# Patient Record
Sex: Male | Born: 1975 | Race: White | Hispanic: No | Marital: Married | Smoking: Current every day smoker
Health system: Southern US, Community
[De-identification: ages and names within clinical notes are randomized; demographics above are authoritative.]

## PROBLEM LIST (undated history)

## (undated) DIAGNOSIS — M1990 Unspecified osteoarthritis, unspecified site: Secondary | ICD-10-CM

## (undated) DIAGNOSIS — M722 Plantar fascial fibromatosis: Secondary | ICD-10-CM

## (undated) DIAGNOSIS — G5600 Carpal tunnel syndrome, unspecified upper limb: Secondary | ICD-10-CM

## (undated) DIAGNOSIS — I499 Cardiac arrhythmia, unspecified: Secondary | ICD-10-CM

## (undated) HISTORY — PX: CARDIAC CATHETERIZATION: SHX172

## (undated) HISTORY — PX: CARDIAC ELECTROPHYSIOLOGY MAPPING AND ABLATION: SHX1292

---

## 2000-05-24 ENCOUNTER — Encounter: Payer: Self-pay | Admitting: Ophthalmology

## 2000-05-24 ENCOUNTER — Ambulatory Visit (HOSPITAL_COMMUNITY): Admission: RE | Admit: 2000-05-24 | Discharge: 2000-05-24 | Payer: Self-pay | Admitting: Ophthalmology

## 2014-02-25 ENCOUNTER — Ambulatory Visit (HOSPITAL_COMMUNITY): Payer: Self-pay | Attending: Emergency Medicine

## 2014-02-25 ENCOUNTER — Emergency Department (INDEPENDENT_AMBULATORY_CARE_PROVIDER_SITE_OTHER)
Admission: EM | Admit: 2014-02-25 | Discharge: 2014-02-25 | Disposition: A | Discharge status: Home / Self Care | Payer: Self-pay | Source: Home / Self Care | Attending: Emergency Medicine | Admitting: Emergency Medicine

## 2014-02-25 ENCOUNTER — Encounter (HOSPITAL_COMMUNITY): Payer: Self-pay | Admitting: Emergency Medicine

## 2014-02-25 DIAGNOSIS — R05 Cough: Secondary | ICD-10-CM | POA: Insufficient documentation

## 2014-02-25 DIAGNOSIS — R059 Cough, unspecified: Secondary | ICD-10-CM | POA: Insufficient documentation

## 2014-02-25 DIAGNOSIS — J209 Acute bronchitis, unspecified: Secondary | ICD-10-CM

## 2014-02-25 HISTORY — DX: Cardiac arrhythmia, unspecified: I49.9

## 2014-02-25 HISTORY — DX: Unspecified osteoarthritis, unspecified site: M19.90

## 2014-02-25 HISTORY — DX: Plantar fascial fibromatosis: M72.2

## 2014-02-25 HISTORY — DX: Carpal tunnel syndrome, unspecified upper limb: G56.00

## 2014-02-25 MED ORDER — ALBUTEROL SULFATE HFA 108 (90 BASE) MCG/ACT IN AERS: 2.0000 | INHALATION_SPRAY | Freq: Four times a day (QID) | RESPIRATORY_TRACT | Status: AC

## 2014-02-25 MED ORDER — AZITHROMYCIN 250 MG PO TABS: ORAL_TABLET | ORAL | Status: AC

## 2014-02-25 MED ORDER — GUAIFENESIN-CODEINE 100-10 MG/5ML PO SYRP: 10.0000 mL | ORAL_SOLUTION | Freq: Four times a day (QID) | ORAL | Status: AC | PRN

## 2014-02-25 MED ORDER — METHYLPREDNISOLONE ACETATE 80 MG/ML IJ SUSP
INTRAMUSCULAR | Status: AC
  Filled 2014-02-25: qty 1

## 2014-02-25 MED ORDER — HYDROCODONE-ACETAMINOPHEN 5-325 MG PO TABS
ORAL_TABLET | ORAL | Status: AC
  Filled 2014-02-25: qty 2

## 2014-02-25 MED ORDER — PREDNISONE 20 MG PO TABS: ORAL_TABLET | ORAL | Status: AC

## 2014-02-25 MED ORDER — METHYLPREDNISOLONE ACETATE 80 MG/ML IJ SUSP
80.0000 mg | Freq: Once | INTRAMUSCULAR | Status: AC
  Administered 2014-02-25: 80 mg via INTRAMUSCULAR

## 2014-02-25 MED ORDER — HYDROCODONE-ACETAMINOPHEN 5-325 MG PO TABS
2.0000 | ORAL_TABLET | Freq: Once | ORAL | Status: AC
  Administered 2014-02-25: 2 via ORAL

## 2014-02-25 NOTE — ED Notes (Signed)
States he has been coughing since yesterday, minimal relief w OTC medications and home treatments. C/o cough so hard that he gags and vomits Moves w free and fluid gait, NAD.

## 2014-02-25 NOTE — ED Provider Notes (Signed)
Chief Complaint   Chief Complaint  Patient presents with  . Cough    History of Present Illness   Kennyth ArnoldGarrett Burmester is a 38 year old male who has had a two-day history of dry cough, posttussive vomiting, nasal congestion, and rhinorrhea. He denies any fever, chills, headache, sore throat, chest pain, or wheezing. His cough is paroxysmal and sometimes he feels like he can't get his breath. His wife has had the same thing. The patient notes that he he was exposed to some Clorox fumes this morning, but that was after the cough began. It did seem to worsen the cough afterwards.  Review of Systems   Other than as noted above, the patient denies any of the following symptoms: Systemic:  No fevers, chills, sweats, or myalgias. Eye:  No redness or discharge. ENT:  No ear pain, headache, nasal congestion, drainage, sinus pressure, or sore throat. Neck:  No neck pain, stiffness, or swollen glands. Lungs:  No cough, sputum production, hemoptysis, wheezing, chest tightness, shortness of breath or chest pain. GI:  No abdominal pain, nausea, vomiting or diarrhea.  PMFSH   Past medical history, family history, social history, meds, and allergies were reviewed.   Physical exam   Vital signs:  BP 139/87  Pulse 75  Temp(Src) 98.6 F (37 C) (Oral)  SpO2 97% General:  Alert and oriented.  In no distress.  Skin warm and dry. The patient had wanted to severe paroxysms of coughing. Eye:  No conjunctival injection or drainage. Lids were normal. ENT:  TMs and canals were normal, without erythema or inflammation.  Nasal mucosa was clear and uncongested, without drainage.  Mucous membranes were moist.  Pharynx was clear with no exudate or drainage.  There were no oral ulcerations or lesions. Neck:  Supple, no adenopathy, tenderness or mass. Lungs:  No respiratory distress.  Lungs were clear to auscultation, without wheezes, rales or rhonchi.  Breath sounds were clear and equal bilaterally.  Heart:  Regular  rhythm, without gallops, murmers or rubs. Skin:  Clear, warm, and dry, without rash or lesions.  Radiology   Dg Chest 2 View  02/25/2014   CLINICAL DATA:  Cough  EXAM: CHEST - 2 VIEW  COMPARISON:  None.  FINDINGS: Normal heart size. Clear lungs. Mild bronchitic changes. No pleural effusion or pneumothorax.  IMPRESSION: No active cardiopulmonary disease.   Electronically Signed   By: Maryclare BeanArt  Hoss M.D.   On: 02/25/2014 19:52   Assessment     The encounter diagnosis was Acute bronchitis.  Plan    1.  Meds:  The following meds were prescribed:   New Prescriptions   ALBUTEROL (PROVENTIL HFA;VENTOLIN HFA) 108 (90 BASE) MCG/ACT INHALER    Inhale 2 puffs into the lungs 4 (four) times daily.   AZITHROMYCIN (ZITHROMAX Z-PAK) 250 MG TABLET    Take as directed.   GUAIFENESIN-CODEINE (GUIATUSS AC) 100-10 MG/5ML SYRUP    Take 10 mLs by mouth 4 (four) times daily as needed for cough.   PREDNISONE (DELTASONE) 20 MG TABLET    Take 3 daily for 5 days, 2 daily for 5 days, 1 daily for 5 days.    2.  Patient Education/Counseling:  The patient was given appropriate handouts, self care instructions, and instructed in symptomatic relief.  Instructed to get extra fluids, rest, and use a cool mist vaporizer.    3.  Follow up:  The patient was told to follow up here if no better in 3 to 4 days, or sooner if becoming worse  in any way, and given some red flag symptoms such as increasing fever, difficulty breathing, chest pain, or persistent vomiting which would prompt immediate return.  Follow up here as needed.      Reuben Likesavid C Megham Dwyer, MD 02/25/14 2013

## 2014-02-25 NOTE — Discharge Instructions (Signed)

## 2014-12-13 IMAGING — CR DG CHEST 2V
2 series · 2 of 2 positions shown · non-contrast
Comparison: None.

CLINICAL DATA: Cough

EXAM:
CHEST - 2 VIEW

[w chest pa]
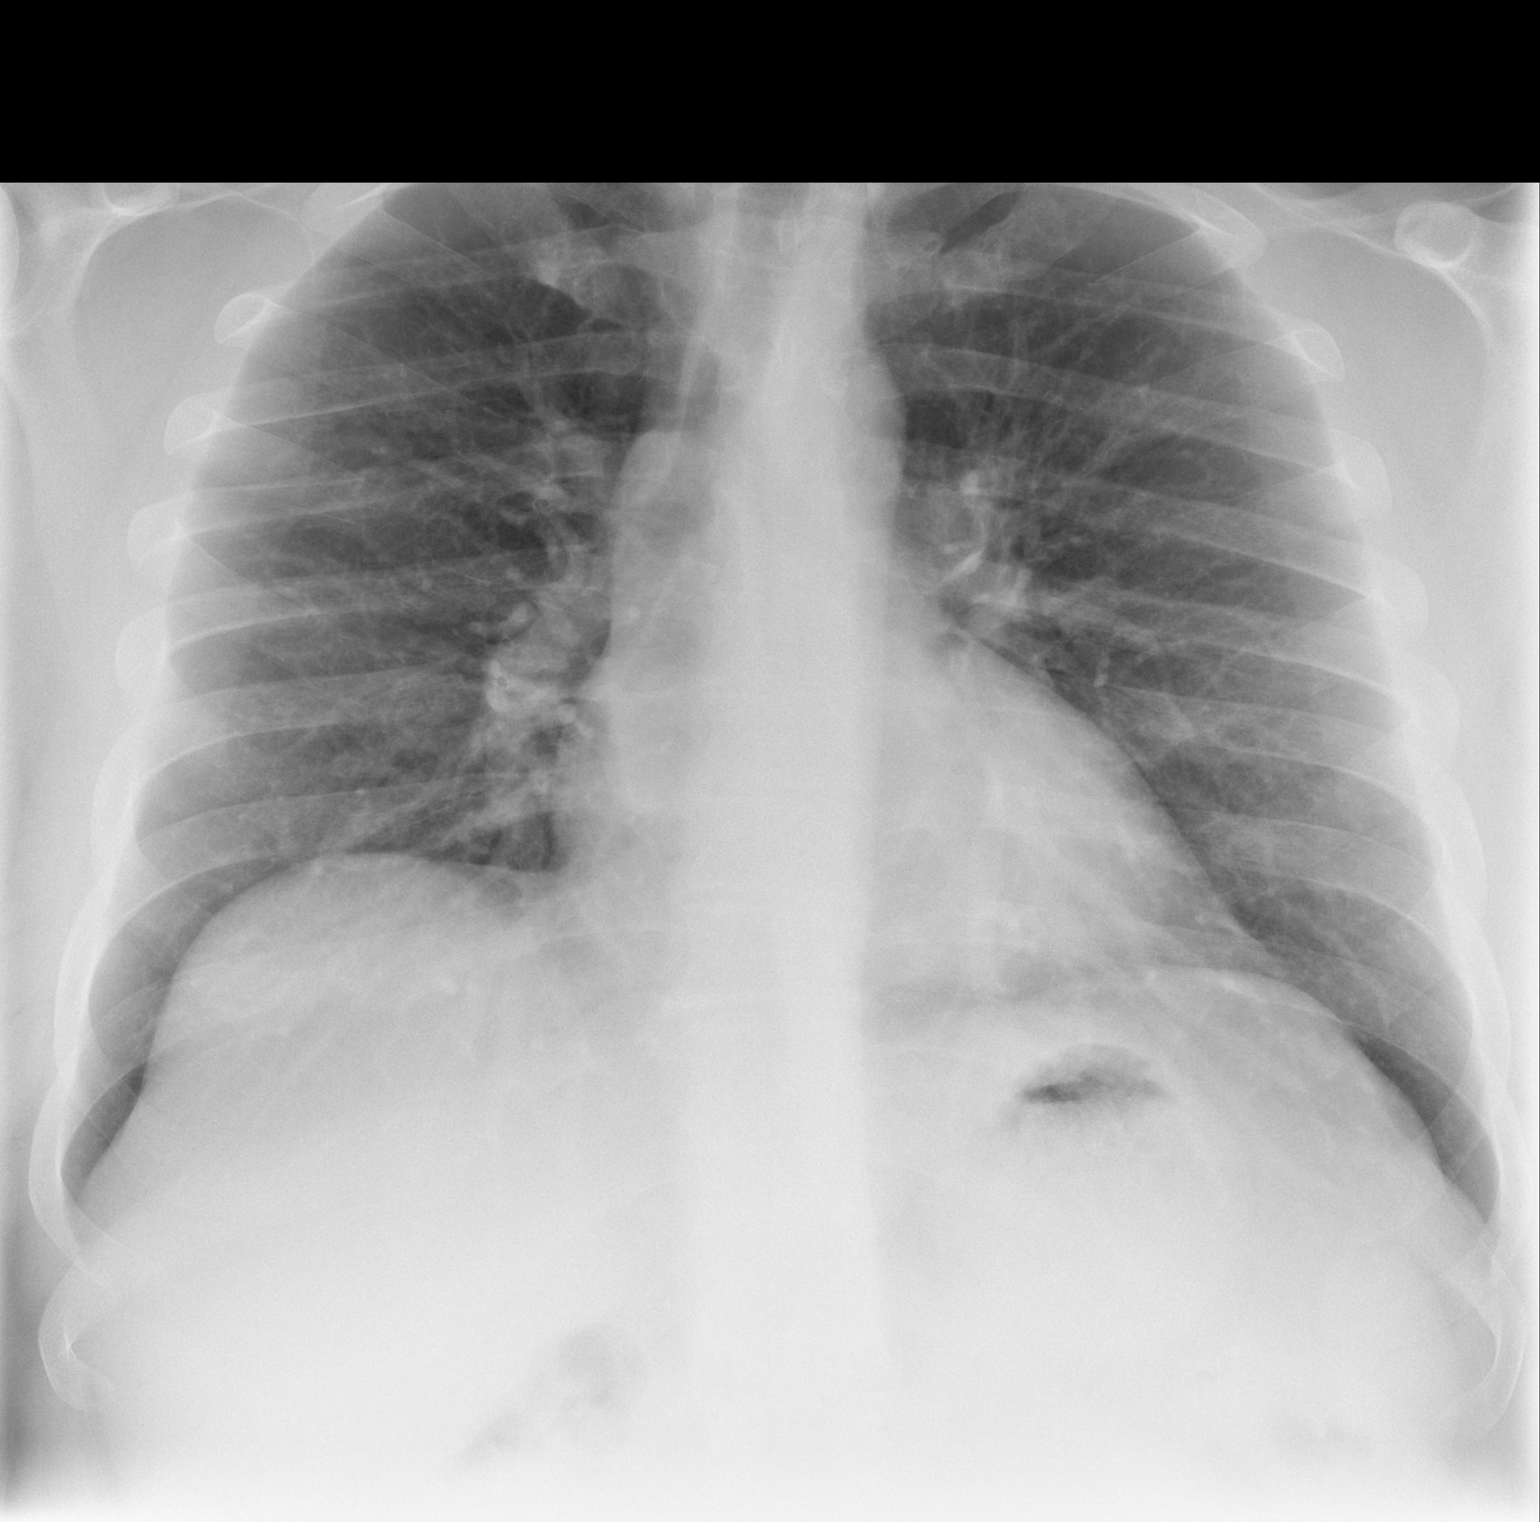

[w chest lat]
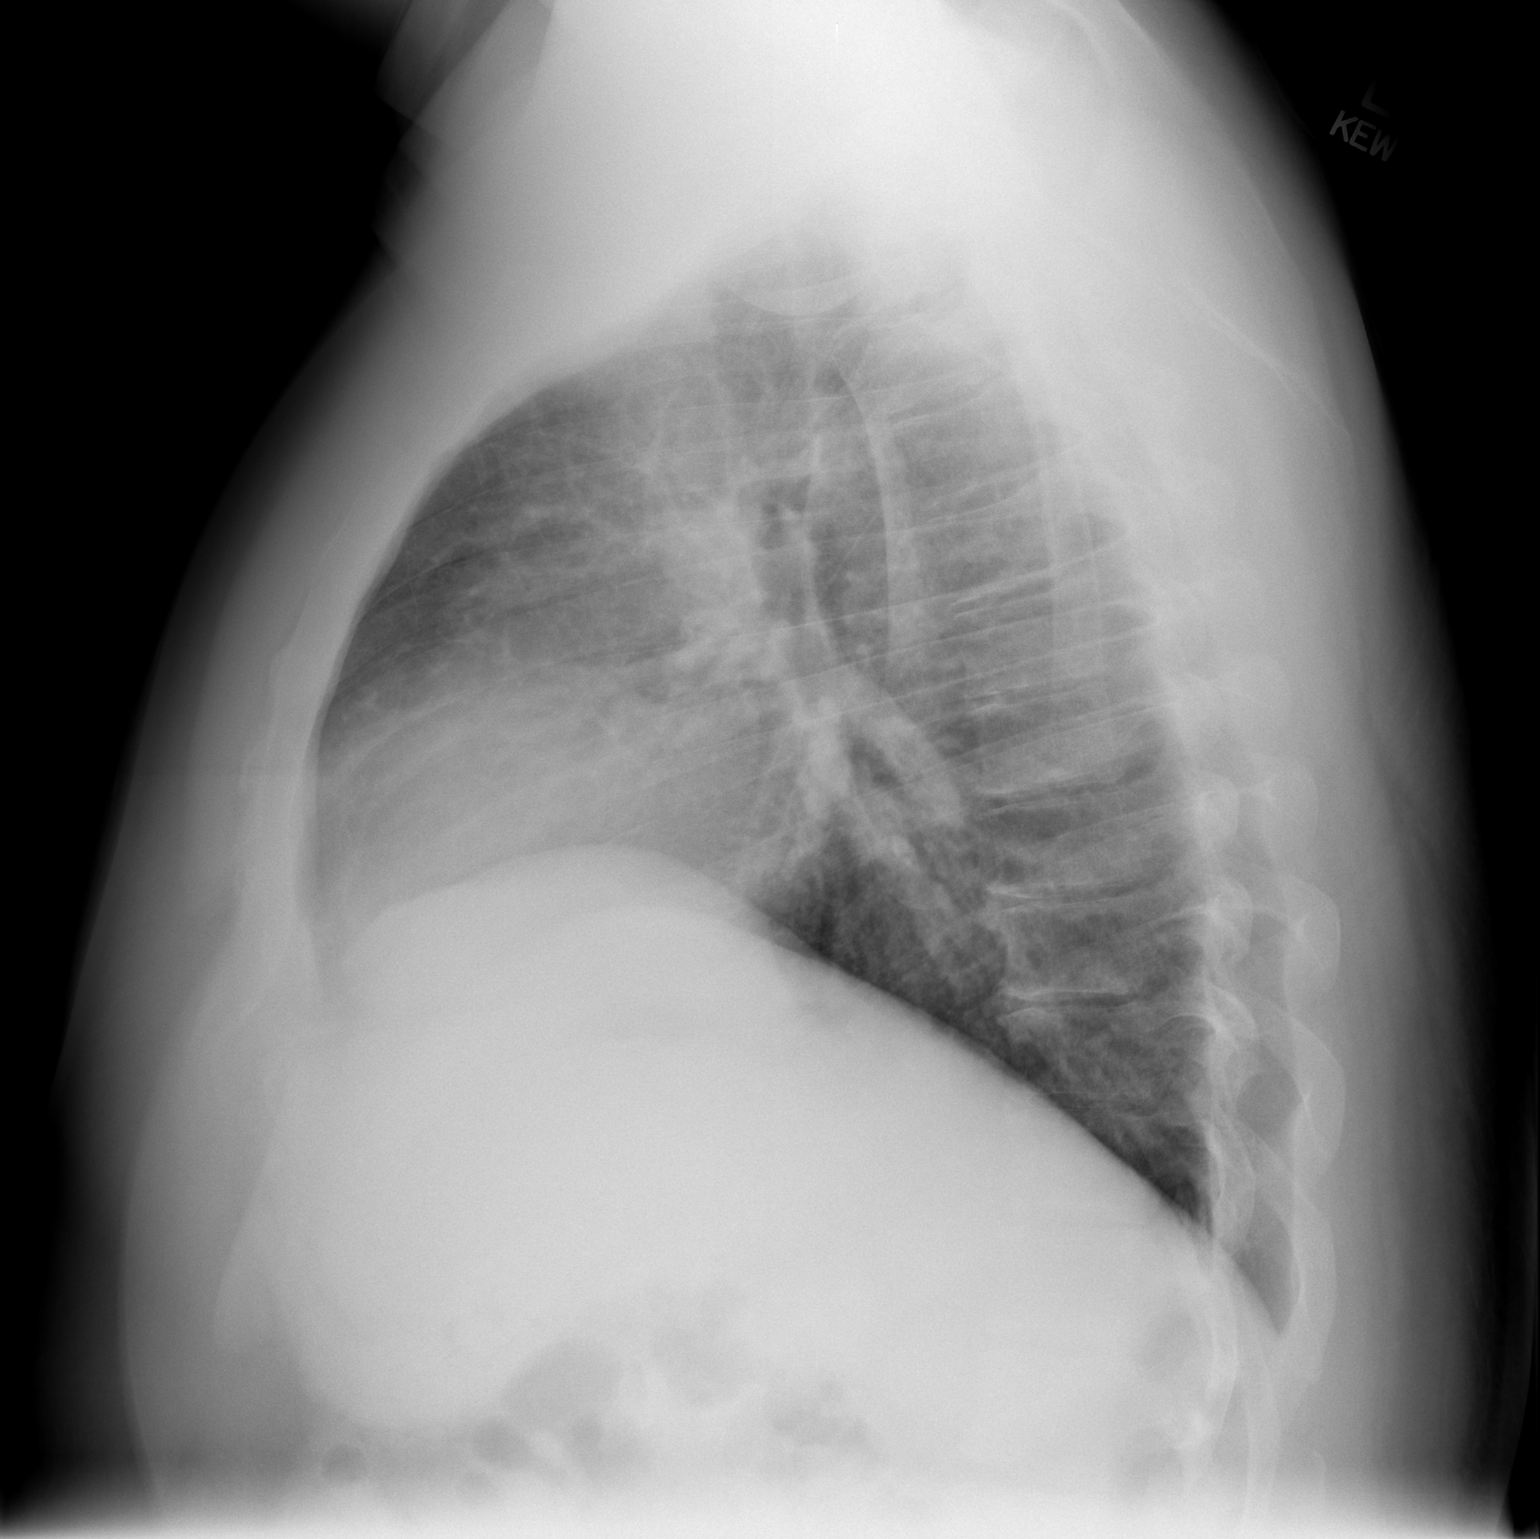

[2 of 2 positions shown; findings below may reference images not displayed]

FINDINGS: Normal heart size. Clear lungs. Mild bronchitic changes. No pleural
effusion or pneumothorax.
IMPRESSION: No active cardiopulmonary disease.
